# Patient Record
Sex: Male | Born: 1960 | Race: White | Hispanic: No | Marital: Single | Smoking: Never smoker
Health system: Southern US, Community
[De-identification: ages and names within clinical notes are randomized; demographics above are authoritative.]

## PROBLEM LIST (undated history)

## (undated) DIAGNOSIS — E079 Disorder of thyroid, unspecified: Secondary | ICD-10-CM

## (undated) HISTORY — PX: HERNIA REPAIR: SHX51

---

## 2010-11-03 ENCOUNTER — Emergency Department: Payer: Self-pay | Admitting: Emergency Medicine

## 2011-06-25 ENCOUNTER — Emergency Department: Payer: Self-pay | Admitting: Emergency Medicine

## 2015-11-01 ENCOUNTER — Other Ambulatory Visit: Payer: Self-pay | Admitting: Occupational Medicine

## 2015-11-01 ENCOUNTER — Ambulatory Visit: Payer: Self-pay

## 2015-11-01 DIAGNOSIS — M545 Low back pain: Secondary | ICD-10-CM

## 2016-01-12 ENCOUNTER — Other Ambulatory Visit (HOSPITAL_COMMUNITY): Payer: Self-pay | Admitting: Orthopedic Surgery

## 2016-01-12 ENCOUNTER — Ambulatory Visit (HOSPITAL_COMMUNITY)
Admission: RE | Admit: 2016-01-12 | Discharge: 2016-01-12 | Disposition: A | Discharge status: Home / Self Care | Payer: Worker's Compensation | Source: Ambulatory Visit | Attending: Orthopedic Surgery | Admitting: Orthopedic Surgery

## 2016-01-12 DIAGNOSIS — S3992XA Unspecified injury of lower back, initial encounter: Secondary | ICD-10-CM

## 2016-01-12 DIAGNOSIS — Z0189 Encounter for other specified special examinations: Secondary | ICD-10-CM | POA: Diagnosis present

## 2016-01-12 DIAGNOSIS — Z1810 Retained metal fragments, unspecified: Secondary | ICD-10-CM | POA: Diagnosis present

## 2017-08-15 IMAGING — DX DG ORBITS FOR FOREIGN BODY
2 series · 2 of 2 positions shown · non-contrast
Comparison: None.

CLINICAL DATA: Metal exposure to orbits ; clearance prior to MRI

EXAM:
ORBITS FOR FOREIGN BODY - 2 VIEW

[orbits waters (1 of 2)]
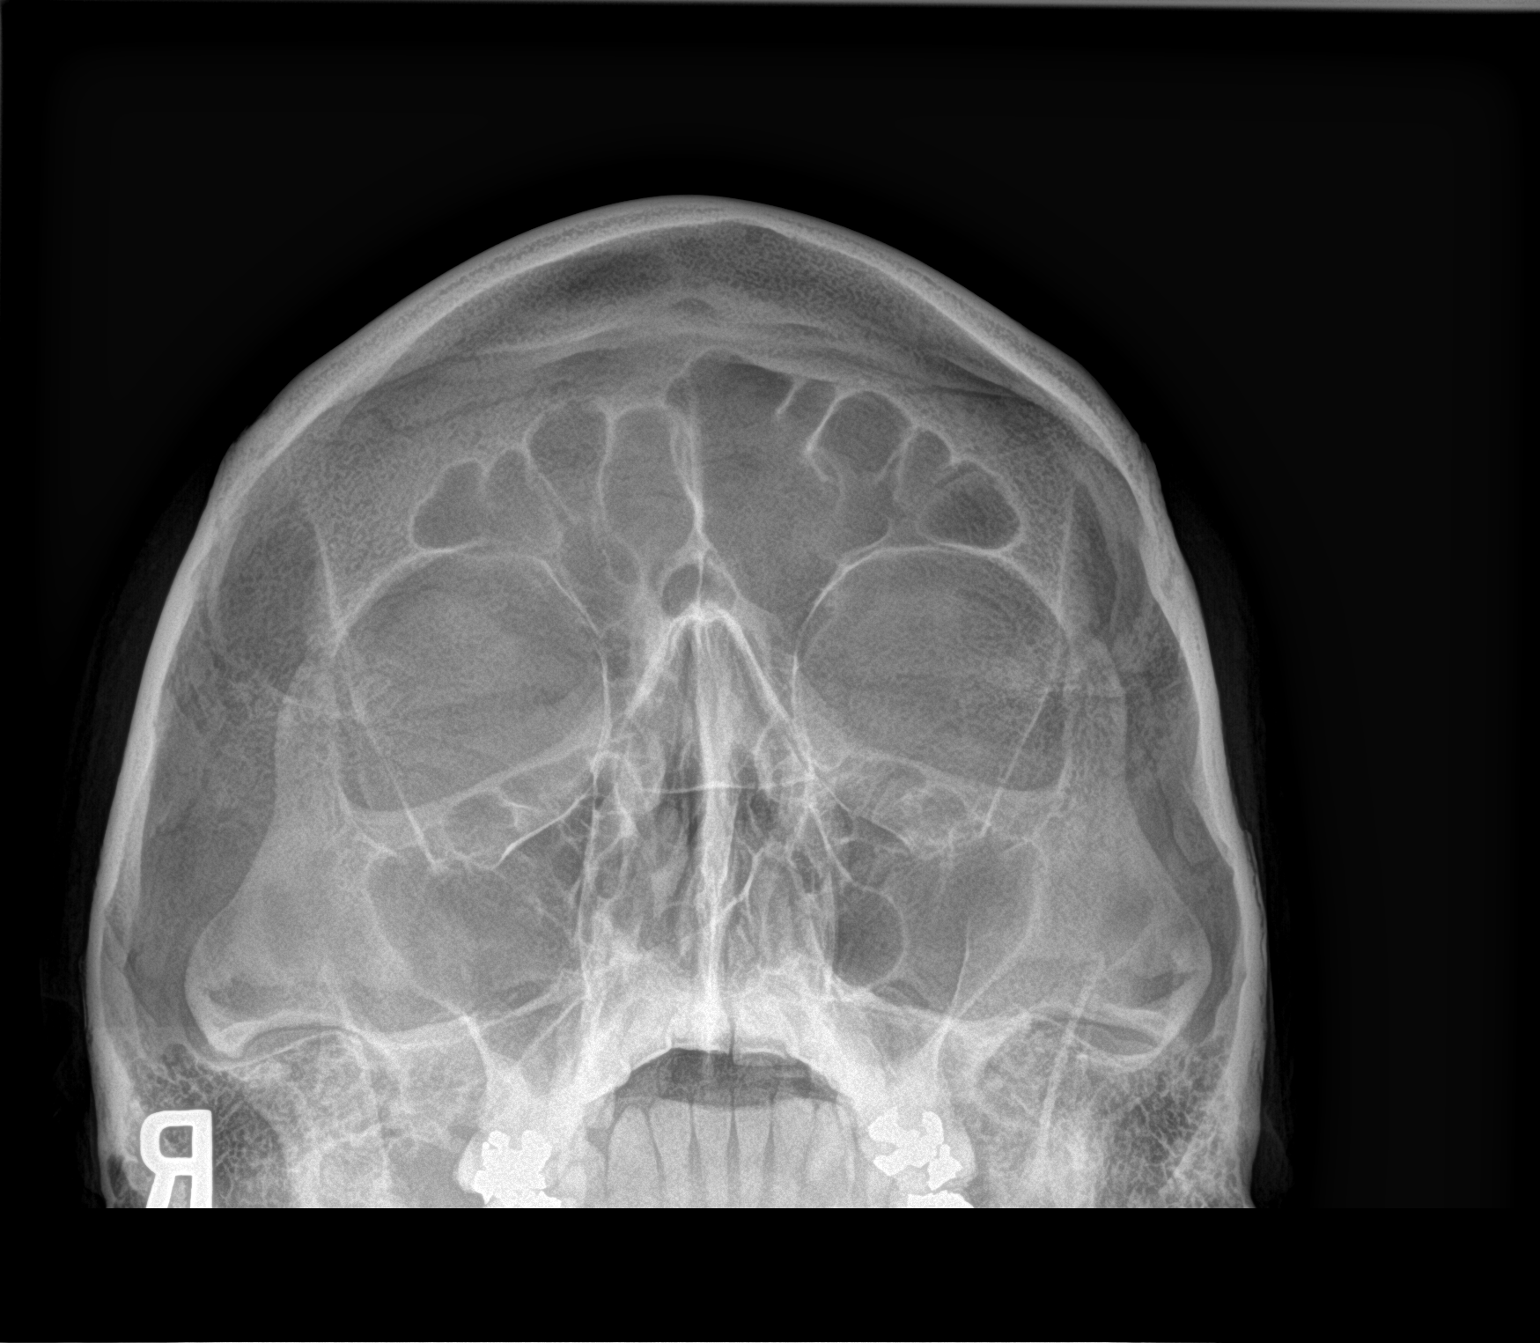

[orbits waters (2 of 2)]
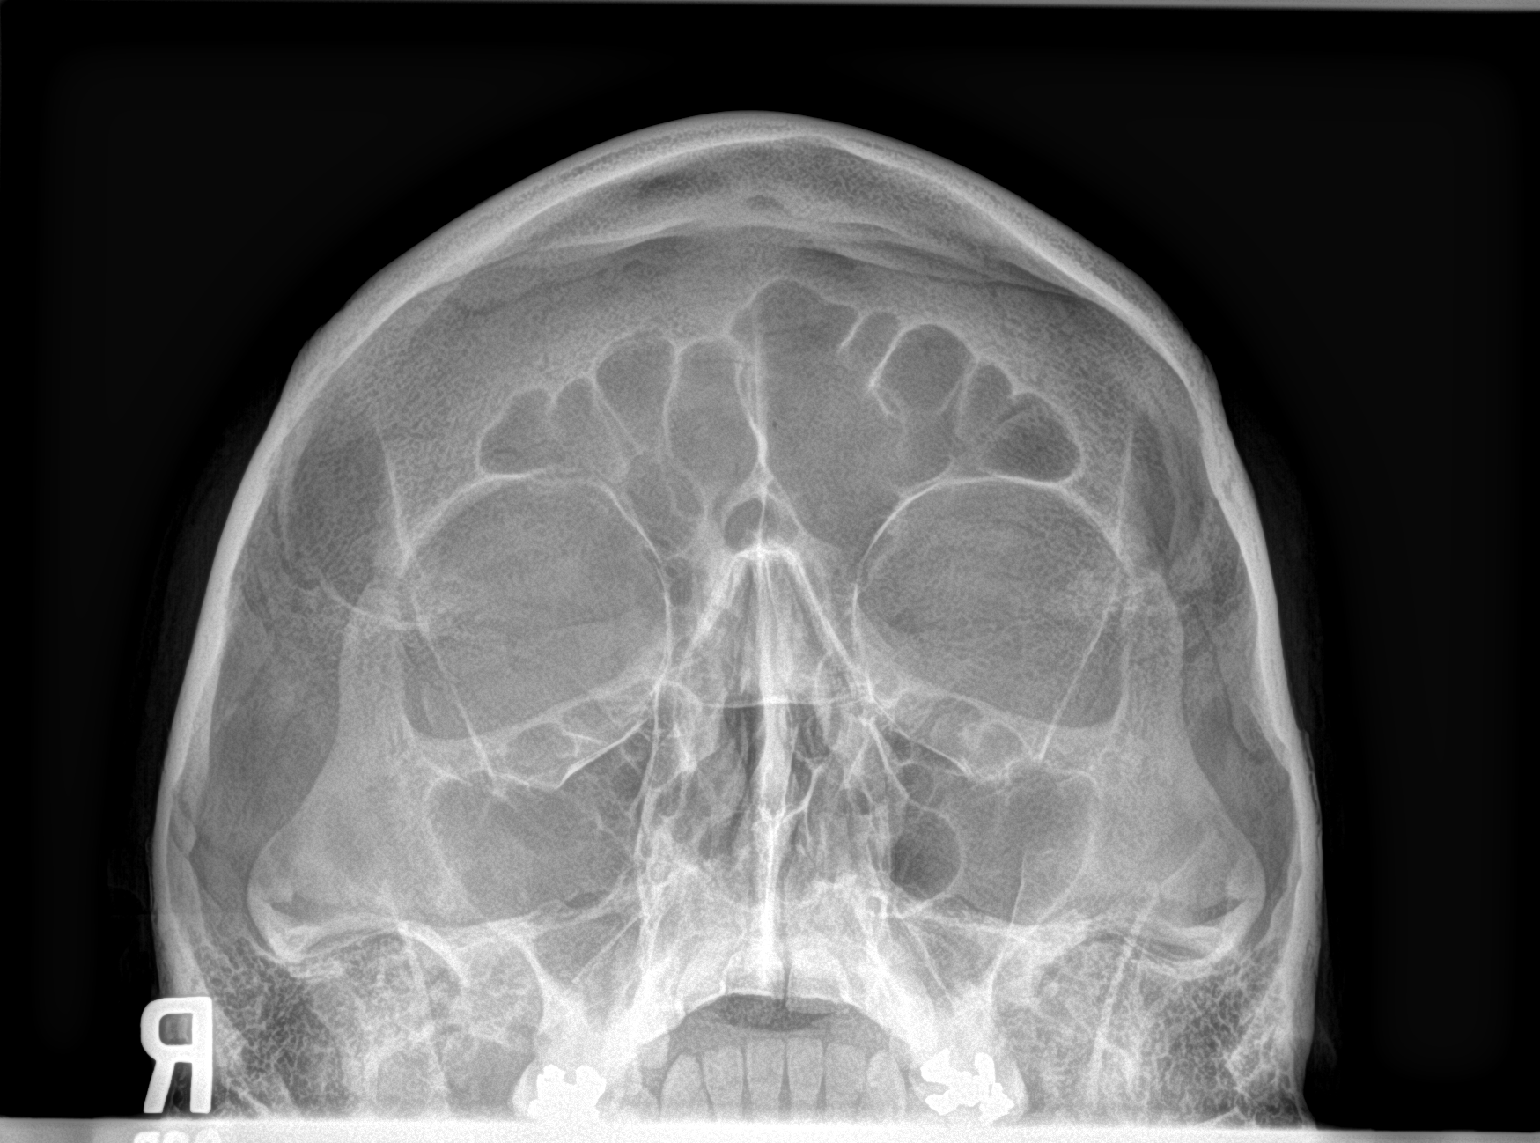

[2 of 2 positions shown; findings below may reference images not displayed]

FINDINGS: There is no evidence of metallic foreign body within the orbits. No
significant bone abnormality identified.
IMPRESSION: No evidence of metallic foreign body within the orbits.

## 2017-09-25 ENCOUNTER — Encounter (HOSPITAL_COMMUNITY): Payer: Self-pay | Admitting: Emergency Medicine

## 2017-09-25 ENCOUNTER — Ambulatory Visit (HOSPITAL_COMMUNITY)
Admission: EM | Admit: 2017-09-25 | Discharge: 2017-09-25 | Disposition: A | Discharge status: Home / Self Care | Payer: Worker's Compensation | Attending: Family Medicine | Admitting: Family Medicine

## 2017-09-25 ENCOUNTER — Other Ambulatory Visit: Payer: Self-pay

## 2017-09-25 DIAGNOSIS — S0502XA Injury of conjunctiva and corneal abrasion without foreign body, left eye, initial encounter: Secondary | ICD-10-CM

## 2017-09-25 DIAGNOSIS — S0501XA Injury of conjunctiva and corneal abrasion without foreign body, right eye, initial encounter: Secondary | ICD-10-CM

## 2017-09-25 DIAGNOSIS — Z23 Encounter for immunization: Secondary | ICD-10-CM

## 2017-09-25 DIAGNOSIS — S0500XA Injury of conjunctiva and corneal abrasion without foreign body, unspecified eye, initial encounter: Secondary | ICD-10-CM

## 2017-09-25 HISTORY — DX: Disorder of thyroid, unspecified: E07.9

## 2017-09-25 MED ORDER — TETRACAINE HCL 0.5 % OP SOLN
OPHTHALMIC | Status: AC
  Filled 2017-09-25: qty 4

## 2017-09-25 MED ORDER — ERYTHROMYCIN 5 MG/GM OP OINT: TOPICAL_OINTMENT | OPHTHALMIC | 0 refills | Status: AC

## 2017-09-25 MED ORDER — TETANUS-DIPHTH-ACELL PERTUSSIS 5-2.5-18.5 LF-MCG/0.5 IM SUSP
INTRAMUSCULAR | Status: AC
  Filled 2017-09-25: qty 0.5

## 2017-09-25 MED ORDER — TETANUS-DIPHTH-ACELL PERTUSSIS 5-2.5-18.5 LF-MCG/0.5 IM SUSP
0.5000 mL | Freq: Once | INTRAMUSCULAR | Status: AC
  Administered 2017-09-25: 0.5 mL via INTRAMUSCULAR

## 2017-09-25 MED ORDER — HYPROMELLOSE 0.3 % OP GEL: OPHTHALMIC | 0 refills | Status: AC | PRN

## 2017-09-25 NOTE — ED Triage Notes (Signed)
Boarding up broke windows of safety glass.  This occurred today. Powdered or particulates from broken safety glass hit patient in the face.  Both eyes feel like something is in both eyes.   Does not wear corrective lenses.    Denies any blurry vision.  Patient says he is just uncomfortable

## 2017-09-25 NOTE — ED Provider Notes (Signed)
[facility]   [id number] 09/25/17 Arrival Time: 1724   SUBJECTIVE:  Genella MechRonald L Moll is a 57 y.o. male hx significant for alopecia who presents with complaint of eye discomfort and FB sensation that began abruptly today.  It while working in his normal capacity installing safety glass that broke.  States glass, and/or dust may have gotten into his eyes after the incident.  Has not tried OTC medications or eye drops.  Denies aggravating factors.  Reports similar symptoms in the past and has had FB removed from his eyes.  Denies eye pain, discharge, itching, vision changes, blurred vision, double vision, photophobia, or excessive tearing.   Denies contact lens use.    Tetanus status unknown.    ROS: As per HPI.  Past Medical History:  Diagnosis Date  . Thyroid disease    Past Surgical History:  Procedure Laterality Date  . HERNIA REPAIR     No Known Allergies No current facility-administered medications on file prior to encounter.    Current Outpatient Medications on File Prior to Encounter  Medication Sig Dispense Refill  . levothyroxine (SYNTHROID, LEVOTHROID) 112 MCG tablet Take 112 mcg by mouth daily before breakfast.     Social History   Socioeconomic History  . Marital status: Single    Spouse name: Not on file  . Number of children: Not on file  . Years of education: Not on file  . Highest education level: Not on file  Occupational History  . Not on file  Social Needs  . Financial resource strain: Not on file  . Food insecurity:    Worry: Not on file    Inability: Not on file  . Transportation needs:    Medical: Not on file    Non-medical: Not on file  Tobacco Use  . Smoking status: Never Smoker  Substance and Sexual Activity  . Alcohol use: Never    Frequency: Never  . Drug use: Never  . Sexual activity: Not on file  Lifestyle  . Physical activity:    Days per week: Not on file    Minutes per session: Not on file  . Stress: Not on file    Relationships  . Social connections:    Talks on phone: Not on file    Gets together: Not on file    Attends religious service: Not on file    Active member of club or organization: Not on file    Attends meetings of clubs or organizations: Not on file    Relationship status: Not on file  . Intimate partner violence:    Fear of current or ex partner: Not on file    Emotionally abused: Not on file    Physically abused: Not on file    Forced sexual activity: Not on file  Other Topics Concern  . Not on file  Social History Narrative  . Not on file   History reviewed. No pertinent family history.  OBJECTIVE:    Visual Acuity  Right Eye Distance: 20/20 Left Eye Distance: 20/30 Bilateral Distance: 20/20  Right Eye Near:   Left Eye Near:    Bilateral Near:      Vitals:   09/25/17 1810  BP: 111/73  Pulse: 67  Resp: 18  Temp: 98.1 F (36.7 C)  TempSrc: Oral  SpO2: 100%    General appearance: alert; no distress Eyes: No eyelashes or eyebrows present, Sclerae white, noninjected; PEERL; EOMI grossly; fluorescein eye exam performed with positive uptake in bilateral eyes, no  obvious FB present. Neck: supple Lungs: clear to auscultation bilaterally Heart: regular rate and rhythm Skin: warm and dry Psychological: alert and cooperative; normal mood and affect   ASSESSMENT & PLAN:  1. Corneal abrasion, unspecified laterality, initial encounter    Discussed patient case with Dr. Tracie Harrier.  We will prescribe erythromycin ointment at this times.  Patient will use genteal tears and OTC medications for symptomatic relief.  Tetanus updated.  Patient will follow up with Occupational health tomorrow for further evaluation and management of his symptoms.  Strict return and ER precautions given.   No orders of the defined types were placed in this encounter.  Tetanus updated.   Use erythromycin ointment as prescribed and to completion Use genteal gel eye drops at night as needed for  symptomatic relief Use OTC ibuprofen or tylenol as needed for pain relief Follow up with occupational health for further evaluation and mangement Return here, go to the ER, or follow up with ophthamolgy if symptoms persists or worsen   Reviewed expectations re: course of current medical issues. Questions answered. Outlined signs and symptoms indicating need for more acute intervention. Patient verbalized understanding. After Visit Summary given.   Rennis Harding, PA-C 09/25/17 2112

## 2017-09-25 NOTE — Discharge Instructions (Addendum)
Tetanus updated.   Use erythromycin ointment as prescribed and to completion Use genteal gel eye drops at night as needed for symptomatic relief Use OTC ibuprofen or tylenol as needed for pain relief Follow up with occupational health for further evaluation and mangement Return here, go to the ER, or follow up with ophthamolgy if symptoms persists or worsen
